# Patient Record
Sex: Male | Born: 1990 | Hispanic: Yes | Marital: Single | Smoking: Never smoker
Health system: Southern US, Community
[De-identification: ages and names within clinical notes are randomized; demographics above are authoritative.]

---

## 2020-02-07 ENCOUNTER — Encounter (HOSPITAL_COMMUNITY): Payer: Self-pay | Admitting: *Deleted

## 2020-02-07 ENCOUNTER — Emergency Department (HOSPITAL_COMMUNITY): Payer: Self-pay

## 2020-02-07 ENCOUNTER — Other Ambulatory Visit: Payer: Self-pay

## 2020-02-07 ENCOUNTER — Emergency Department (HOSPITAL_COMMUNITY)
Admission: EM | Admit: 2020-02-07 | Discharge: 2020-02-07 | Disposition: A | Discharge status: Home / Self Care | Payer: Self-pay | Attending: Emergency Medicine | Admitting: Emergency Medicine

## 2020-02-07 DIAGNOSIS — G44229 Chronic tension-type headache, not intractable: Secondary | ICD-10-CM | POA: Insufficient documentation

## 2020-02-07 MED ORDER — DIPHENHYDRAMINE HCL 50 MG/ML IJ SOLN
25.0000 mg | Freq: Once | INTRAMUSCULAR | Status: AC
  Administered 2020-02-07: 25 mg via INTRAVENOUS
  Filled 2020-02-07: qty 1

## 2020-02-07 MED ORDER — ACETAMINOPHEN 325 MG PO TABS
650.0000 mg | ORAL_TABLET | Freq: Once | ORAL | Status: AC
  Administered 2020-02-07: 650 mg via ORAL
  Filled 2020-02-07: qty 2

## 2020-02-07 MED ORDER — SODIUM CHLORIDE 0.9 % IV BOLUS
1000.0000 mL | Freq: Once | INTRAVENOUS | Status: AC
  Administered 2020-02-07: 1000 mL via INTRAVENOUS

## 2020-02-07 MED ORDER — METOCLOPRAMIDE HCL 5 MG/ML IJ SOLN
10.0000 mg | Freq: Once | INTRAMUSCULAR | Status: AC
  Administered 2020-02-07: 10 mg via INTRAVENOUS
  Filled 2020-02-07: qty 2

## 2020-02-07 NOTE — ED Notes (Signed)
Got patient on the monitor patient is resting with call bell in reach  ?
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
## 2020-02-07 NOTE — Discharge Instructions (Addendum)
The CT scan of your head today did not show any abnormal findings or any causes of your headache. It is important for you to follow-up with the primary care provider listed below. You can continue medication such as Tylenol or ibuprofen to help with your headache. Return to the ER if you start to experience worsening headache, blurry vision, numbness in arms or legs, chest pain, neck stiffness, injuries or falls.    La tomografa computarizada de su cabeza hoy no mostr ningn hallazgo anormal ni ninguna causa de su dolor de [place]. Es importante que realice un seguimiento con el proveedor de atencin primaria que se indica a continuacin. Puede continuar con medicamentos como Tylenol o ibuprofeno para ayudar con su dolor de cabeza. Regrese a la sala de emergencias si comienza a experimentar un empeoramiento del dolor de cabeza, visin borrosa, entumecimiento en brazos o piernas, dolor en el pecho, rigidez en el cuello, lesiones o cadas.
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
## 2021-06-20 ENCOUNTER — Other Ambulatory Visit: Payer: Self-pay

## 2021-06-20 ENCOUNTER — Encounter: Payer: Self-pay | Admitting: Emergency Medicine

## 2021-06-20 ENCOUNTER — Ambulatory Visit (INDEPENDENT_AMBULATORY_CARE_PROVIDER_SITE_OTHER): Payer: Self-pay

## 2021-06-20 ENCOUNTER — Ambulatory Visit (HOSPITAL_COMMUNITY)
Admission: EM | Admit: 2021-06-20 | Discharge: 2021-06-20 | Disposition: A | Discharge status: Home / Self Care | Payer: Self-pay | Attending: Family Medicine | Admitting: Family Medicine

## 2021-06-20 ENCOUNTER — Ambulatory Visit
Admission: EM | Admit: 2021-06-20 | Discharge: 2021-06-20 | Disposition: A | Discharge status: Home / Self Care | Payer: Self-pay | Attending: Internal Medicine | Admitting: Internal Medicine

## 2021-06-20 DIAGNOSIS — M79642 Pain in left hand: Secondary | ICD-10-CM

## 2021-06-20 DIAGNOSIS — S62325A Displaced fracture of shaft of fourth metacarpal bone, left hand, initial encounter for closed fracture: Secondary | ICD-10-CM

## 2021-06-20 DIAGNOSIS — M25532 Pain in left wrist: Secondary | ICD-10-CM

## 2021-06-20 MED ORDER — HYDROCODONE-ACETAMINOPHEN 5-325 MG PO TABS: 1.0000 | ORAL_TABLET | Freq: Four times a day (QID) | ORAL | 0 refills | Status: AC | PRN

## 2021-06-20 NOTE — ED Provider Notes (Signed)
?[facility] ? ? ? ?CSN: [id number] ?Arrival date & time: 06/20/21  0947 ? ? ?  ? ?History   ?Chief Complaint ?Chief Complaint  ?Patient presents with  ? Hand Injury  ? ? ?HPI ?Andrew Fletcher is a 31 y.o. male.  ? ?Patient presents with left hand and wrist pain that started yesterday after an injury.  Patient reports that he was playing soccer yesterday and fell landing with his hand hyperflexed.  He is currently having left fourth digit finger pain, dorsal surface of left hand pain, left wrist pain.  Patient has full range of motion of wrist and hand.  Denies any numbness or tingling.  Patient has not taken any medications to help alleviate symptoms. ? ? ?Hand Injury ? ?History reviewed. No pertinent past medical history. ? ?There are no problems to display for this patient. ? ? ?History reviewed. No pertinent surgical history. ? ? ? ? ?Home Medications   ? ?Prior to Admission medications   ?Medication Sig Start Date End Date Taking? Authorizing Provider  ?HYDROcodone-acetaminophen (NORCO) 5-325 MG tablet Take 1 tablet by mouth every 6 (six) hours as needed for moderate pain. 06/20/21  Yes Gustavus Bryant, FNP  ? ? ?Family History ?History reviewed. No pertinent family history. ? ?Social History ?Social History  ? ?Tobacco Use  ? Smoking status: Never  ? Smokeless tobacco: Never  ?Substance Use Topics  ? Alcohol use: Never  ? Drug use: Never  ? ? ? ?Allergies   ?Patient has no known allergies. ? ? ?Review of Systems ?Review of Systems ?Per HPI ? ?Physical Exam ?Triage Vital Signs ?ED Triage Vitals  ?Enc Vitals Group  ?   BP 06/20/21 1049 109/68  ?   Pulse Rate 06/20/21 1049 70  ?   Resp 06/20/21 1049 18  ?   Temp 06/20/21 1049 98.4 ?F (36.9 ?C)  ?   Temp Source 06/20/21 1049 Oral  ?   SpO2 06/20/21 1049 98 %  ?   Weight 06/20/21 1050 180 lb (81.6 kg)  ?   Height 06/20/21 1050 5' 10.08" (1.78 m)  ?   Head Circumference --   ?   Peak Flow --   ?   Pain Score 06/20/21 1050 10  ?   Pain Loc --   ?   Pain  Edu? --   ?   Excl. in GC? --   ? ?No data found. ? ?Updated Vital Signs ?BP 109/68 (BP Location: Left Arm)   Pulse 70   Temp 98.4 ?F (36.9 ?C) (Oral)   Resp 18   Ht 5' 10.08" (1.78 m)   Wt 180 lb (81.6 kg)   SpO2 98%   BMI 25.77 kg/m?  ? ?Visual Acuity ?Right Eye Distance:   ?Left Eye Distance:   ?Bilateral Distance:   ? ?Right Eye Near:   ?Left Eye Near:    ?Bilateral Near:    ? ?Physical Exam ?Constitutional:   ?   General: He is not in acute distress. ?   Appearance: Normal appearance. He is not toxic-appearing or diaphoretic.  ?HENT:  ?   Head: Normocephalic and atraumatic.  ?Eyes:  ?   Extraocular Movements: Extraocular movements intact.  ?   Conjunctiva/sclera: Conjunctivae normal.  ?Pulmonary:  ?   Effort: Pulmonary effort is normal.  ?Musculoskeletal:  ?   Comments: Tenderness to palpation to dorsal surface overlying fourth metacarpal.  Also tenderness to palpation generalized throughout left fourth digit.  No obvious swelling  or discoloration noted.  No lacerations or abrasions noted.  Patient has full range of motion of digits, wrist, hand.  Tenderness to palpation generalized throughout left wrist.  Tenderness to palpation slightly into the left anterior forearm as well.  Neurovascular intact.  ?Neurological:  ?   General: No focal deficit present.  ?   Mental Status: He is alert and oriented to person, place, and time. Mental status is at baseline.  ?Psychiatric:     ?   Mood and Affect: Mood normal.     ?   Behavior: Behavior normal.     ?   Thought Content: Thought content normal.     ?   Judgment: Judgment normal.  ? ? ? ?UC Treatments / Results  ?Labs ?(all labs ordered are listed, but only abnormal results are displayed) ?Labs Reviewed - No data to display ? ?EKG ? ? ?Radiology ?DG Wrist Complete Left ? ?Result Date: 06/20/2021 ?CLINICAL DATA:  Left wrist pain after soccer injury yesterday. EXAM: LEFT WRIST - COMPLETE 3+ VIEW COMPARISON:  None. FINDINGS: Minimally displaced fourth metacarpal  fracture is noted. No other bony abnormality is noted. Joint spaces are intact. IMPRESSION: Minimally displaced fourth metacarpal fracture. Electronically Signed   By: Lupita Raider M.D.   On: 06/20/2021 11:27  ? ?DG Hand Complete Left ? ?Result Date: 06/20/2021 ?CLINICAL DATA:  Left hand pain after playing soccer yesterday. EXAM: LEFT HAND - COMPLETE 3+ VIEW COMPARISON:  None. FINDINGS: Minimally displaced oblique fracture is seen involving the fourth metacarpal. Joint spaces are intact. No other bony abnormality is noted. IMPRESSION: Minimally displaced fourth metacarpal fracture. Electronically Signed   By: Lupita Raider M.D.   On: 06/20/2021 11:26   ? ?Procedures ?Procedures (including critical care time) ? ?Medications Ordered in UC ?Medications - No data to display ? ?Initial Impression / Assessment and Plan / UC Course  ?I have reviewed the triage vital signs and the nursing notes. ? ?Pertinent labs & imaging results that were available during my care of the patient were reviewed by me and considered in my medical decision making (see chart for details). ? ?  ? ?Minimally displaced left fourth metacarpal fracture. Charma Igo was consulted due to it being minimally displaced.  He advised ulnar gutter splint and to follow-up this week with Dr. Merlyn Lot with hand specialty.  Patient provided with contact information.  I do not have any splinting supplies here in this urgent care so patient was sent to urgent care on [facility] for splinting by [employer].  Discussed ice application and supportive care.  Norco prescribed as needed for pain.  Advised patient this can cause drowsiness and to not drive while taking this medication.  Patient verbalized understanding and was agreeable with plan.  Interpreter used throughout patient interaction. ?Final Clinical Impressions(s) / UC Diagnoses  ? ?Final diagnoses:  ?Displaced fracture of shaft of fourth metacarpal bone, left hand, initial encounter for closed  fracture  ? ? ? ?Discharge Instructions   ? ?  ?Go to provided address for [facility] urgent care on [facility] to have splint placed.  Follow-up with provided contact information today to schedule an appointment this week to see hand orthopedic specialist.  Please use ice application and elevate extremity as well.  Pain medication is prescribed for you to take as needed.  Please be advised that it can make you drowsy. ? ? ? ? ?ED Prescriptions   ? ? Medication Sig Dispense Auth. Provider  ? HYDROcodone-acetaminophen (  NORCO) 5-325 MG tablet Take 1 tablet by mouth every 6 (six) hours as needed for moderate pain. 30 tablet Gustavus BryantMound, Mariacristina Aday E, [place]  ? ?  ? ?I have reviewed the PDMP during this encounter. ?  ?Gustavus BryantMound, Phu Record E, [place]NP ?06/20/21 1153 ? ?

## 2021-06-20 NOTE — ED Notes (Signed)
Pt was tld by this nurse to go to the urgent care in [place] for the splint to be placed.  ?

## 2021-06-20 NOTE — ED Notes (Signed)
Ortho techs called.  ?

## 2021-06-20 NOTE — ED Triage Notes (Signed)
Patient injured his left hand while playing soccer yesterday.  Left hand is somewhat swelling, patient denies any OTC meds. ?

## 2021-06-20 NOTE — Discharge Instructions (Addendum)
Go to provided address for [facility] urgent care on [facility] to have splint placed.  Follow-up with provided contact information today to schedule an appointment this week to see hand orthopedic specialist.  Please use ice application and elevate extremity as well.  Pain medication is prescribed for you to take as needed.  Please be advised that it can make you drowsy. ?

## 2021-06-20 NOTE — Progress Notes (Signed)
Orthopedic Tech Progress Note ?Patient Details:  ?Andrew Fletcher ?06/08/1990 ?[id number] ? ?Ortho Devices ?Type of Ortho Device: Ulna gutter splint ?Ortho Device/Splint Location: LUE ?Ortho Device/Splint Interventions: Ordered, Application ?  ?Post Interventions ?Patient Tolerated: Well ?Instructions Provided: Care of device ? ?[name] ?06/20/2021, 1:36 PM ? ?

## 2023-05-17 IMAGING — DX DG HAND COMPLETE 3+V*L*
3 series · 3 of 3 positions shown · non-contrast
Comparison: None.

CLINICAL DATA: Left hand pain after playing soccer yesterday.

EXAM:
LEFT HAND - COMPLETE 3+ VIEW

[hand pa]
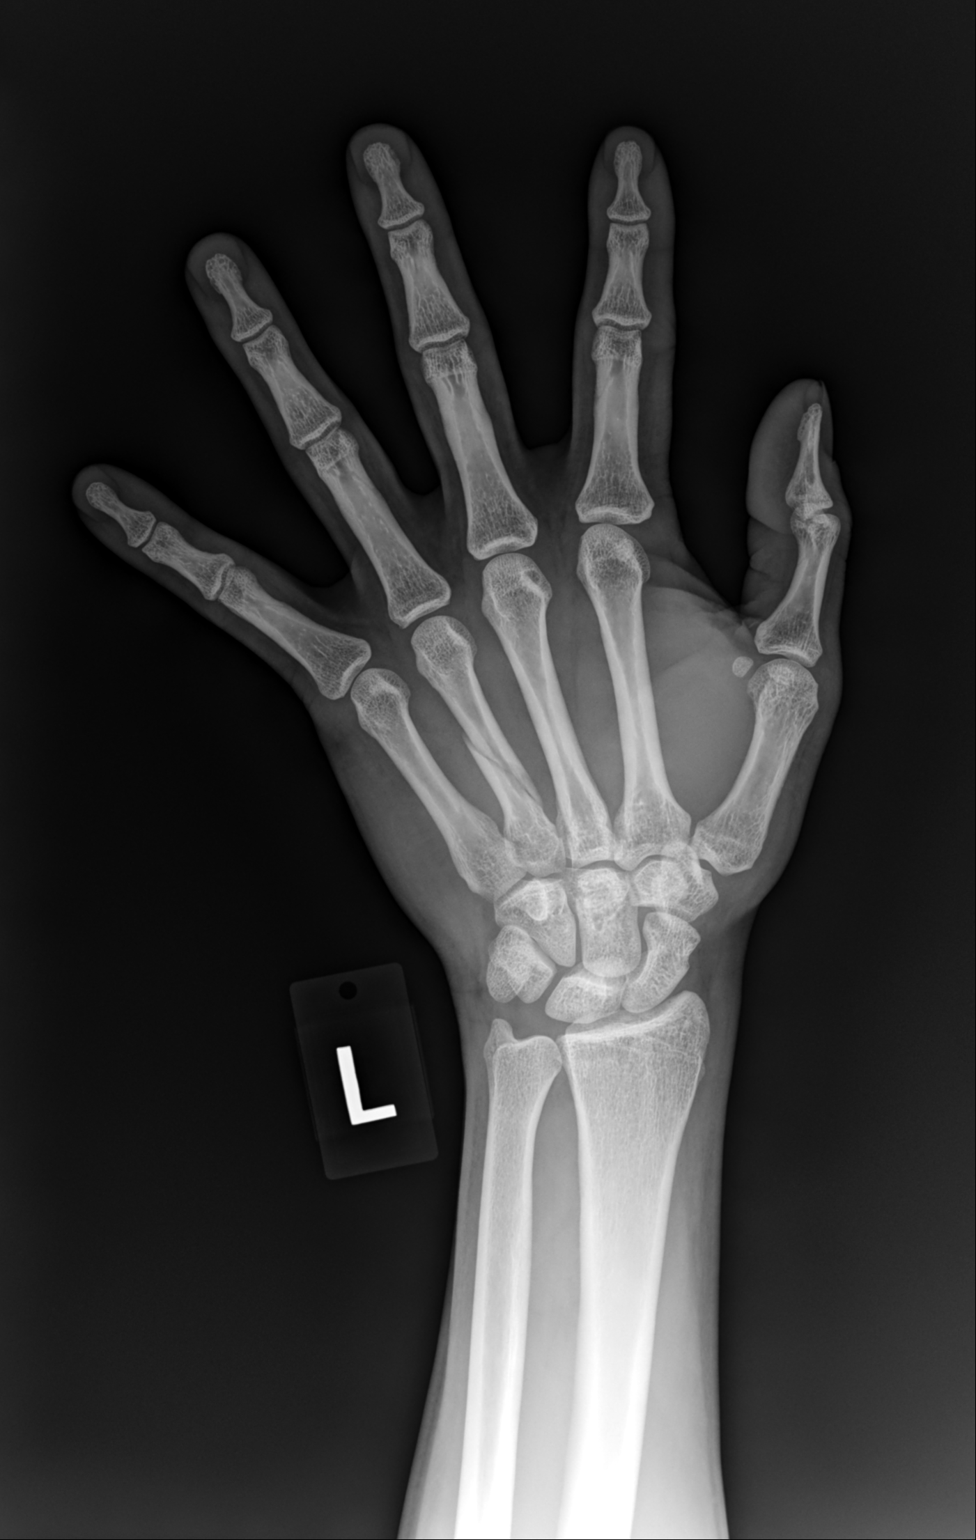

[hand mlo]
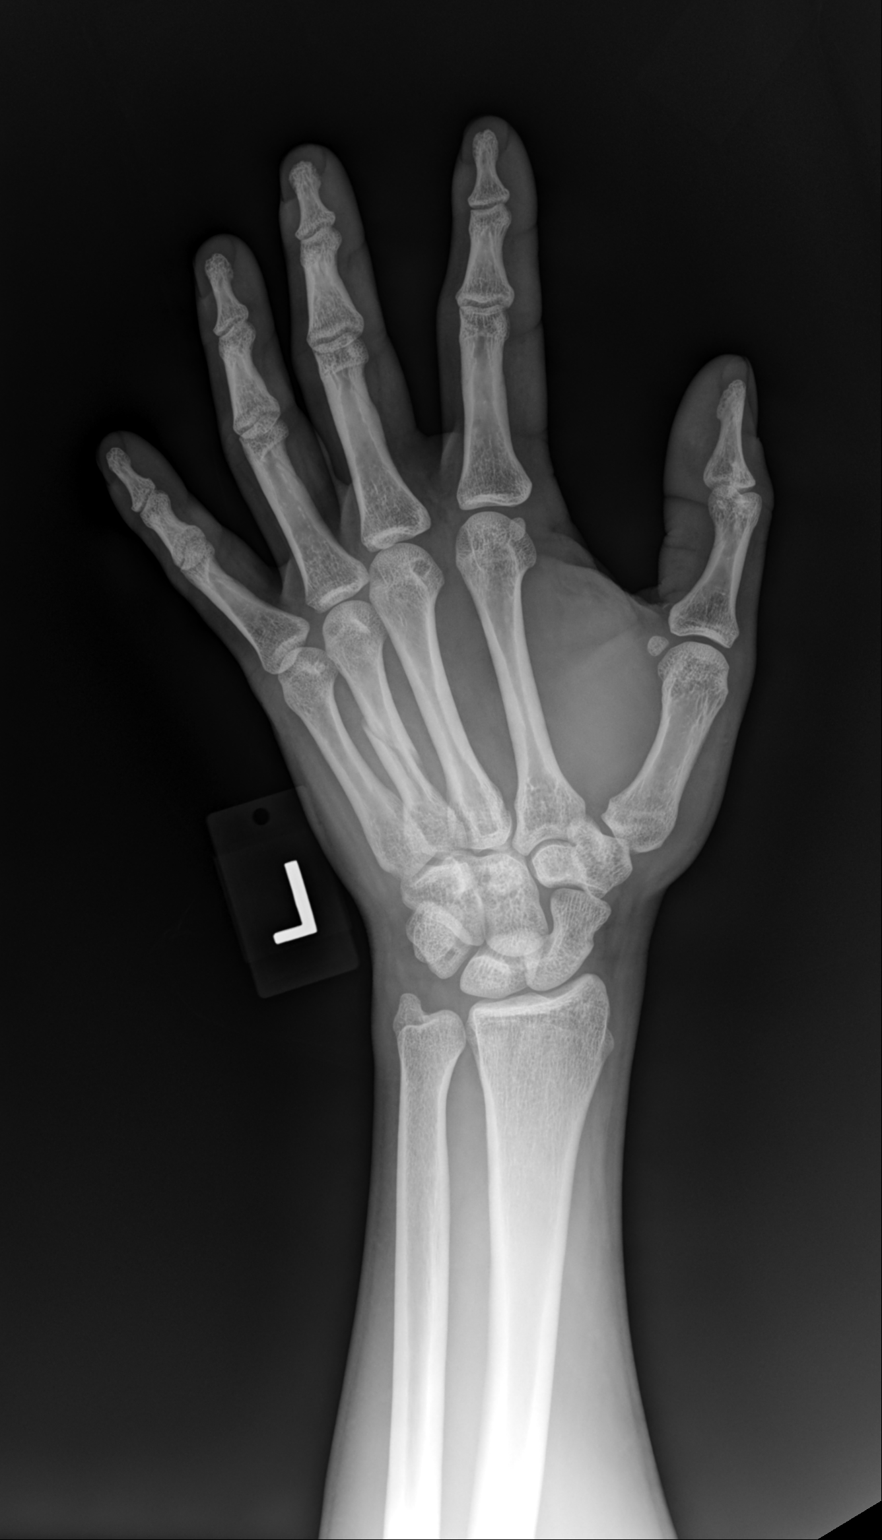

[hand lat]
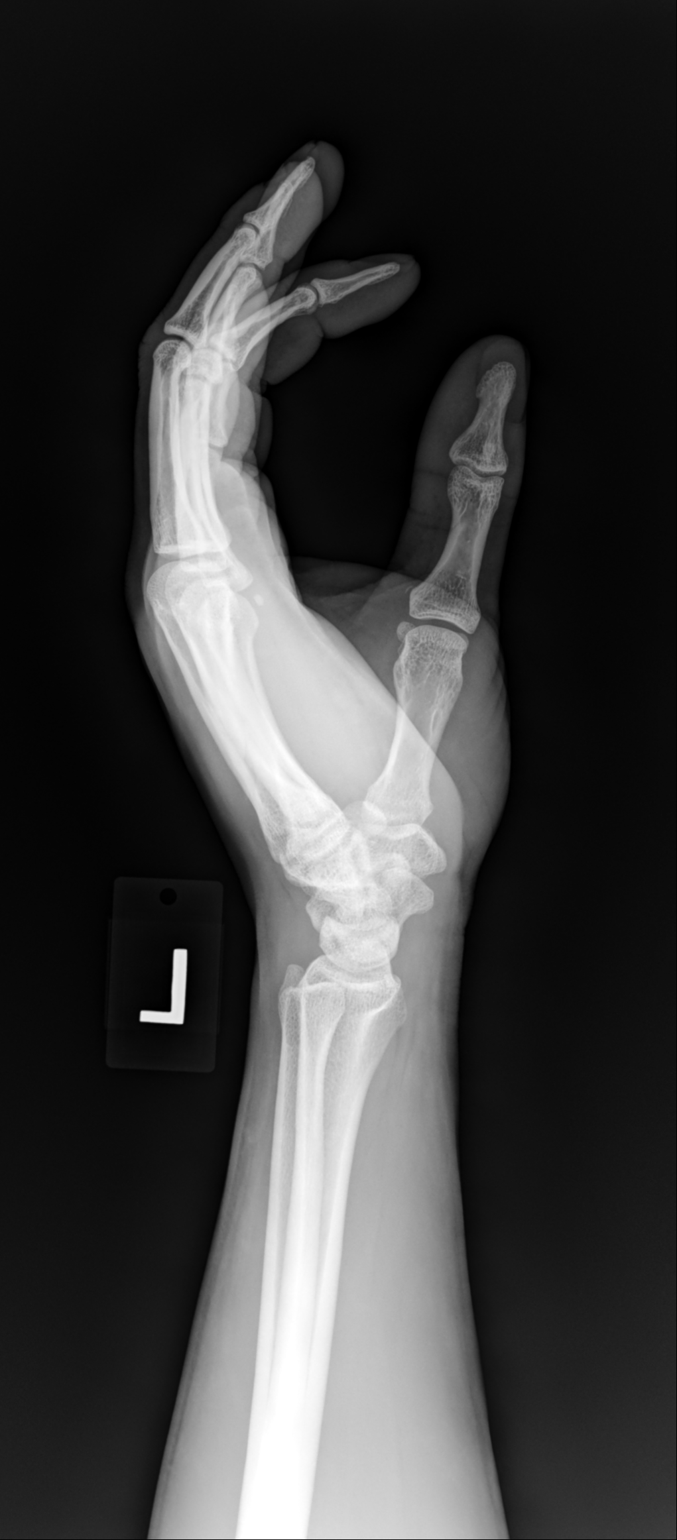

[3 of 3 positions shown; findings below may reference images not displayed]

FINDINGS: Minimally displaced oblique fracture is seen involving the fourth
metacarpal. Joint spaces are intact. No other bony abnormality is
noted.
IMPRESSION: Minimally displaced fourth metacarpal fracture.

## 2023-05-24 ENCOUNTER — Ambulatory Visit
Admission: EM | Admit: 2023-05-24 | Discharge: 2023-05-24 | Disposition: A | Discharge status: Home / Self Care | Payer: Self-pay | Attending: Physician Assistant | Admitting: Physician Assistant

## 2023-05-24 ENCOUNTER — Encounter: Payer: Self-pay | Admitting: Emergency Medicine

## 2023-05-24 DIAGNOSIS — J40 Bronchitis, not specified as acute or chronic: Secondary | ICD-10-CM

## 2023-05-24 DIAGNOSIS — J329 Chronic sinusitis, unspecified: Secondary | ICD-10-CM

## 2023-05-24 DIAGNOSIS — R197 Diarrhea, unspecified: Secondary | ICD-10-CM

## 2023-05-24 LAB — POC COVID19/FLU A&B COMBO
Covid Antigen, POC: NEGATIVE
Influenza A Antigen, POC: NEGATIVE
Influenza B Antigen, POC: NEGATIVE

## 2023-05-24 MED ORDER — AMOXICILLIN-POT CLAVULANATE 875-125 MG PO TABS: 1.0000 | ORAL_TABLET | Freq: Two times a day (BID) | ORAL | 0 refills | Status: AC

## 2023-05-24 NOTE — ED Triage Notes (Addendum)
 Pt reports headache, cough, nausea, diarrhea and nasal congestion x12 days. 2 emesis episodes yesterday. Pt notes 6 loose stools in past 24hrs. Reports decreased intake due to upset stomach. Taking dayquil/nyquil at home with no relief.   Triage completed with interpreter, Hollice Espy [phone]

## 2023-05-24 NOTE — ED Provider Notes (Addendum)
 [facility]    CSN: [id number] Arrival date & time: 05/24/23  4098      History   Chief Complaint Chief Complaint  Patient presents with   Headache   Cough   Emesis   Nausea    HPI Andrew Fletcher is a 33 y.o. male.   Patient here today for evaluation of headache, cough, congestion that he has had the last 12 days.  He has also had nausea and diarrhea during this time.  He has not had any blood in his stool or dark tarry stools.  He denies any abdominal pain.  He denies any recent travel or concerning food choices that might have led to diarrhea.  He does report fever at times but has not checked temperature just has had increased sweating.  The history is provided by the patient. The history is limited by a language barrier. A language interpreter was used.  Headache Associated symptoms: congestion, cough, diarrhea, fever, nausea and sore throat   Associated symptoms: no abdominal pain, no ear pain and no vomiting   Cough Associated symptoms: chills, diaphoresis, fever, headaches and sore throat   Associated symptoms: no ear pain, no eye discharge and no shortness of breath   Emesis Associated symptoms: chills, cough, diarrhea, fever, headaches and sore throat   Associated symptoms: no abdominal pain     History reviewed. No pertinent past medical history.  There are no active problems to display for this patient.   History reviewed. No pertinent surgical history.     Home Medications    Prior to Admission medications   Medication Sig Start Date End Date Taking? Authorizing Provider  amoxicillin-clavulanate (AUGMENTIN) 875-125 MG tablet Take 1 tablet by mouth every 12 (twelve) hours. 05/24/23  Yes Tomi Bamberger, PA-C  HYDROcodone-acetaminophen (NORCO) 5-325 MG tablet Take 1 tablet by mouth every 6 (six) hours as needed for moderate pain. Patient not taking: Reported on 05/24/2023 06/20/21   Gustavus Bryant, FNP    Family History History reviewed.  No pertinent family history.  Social History Social History   Tobacco Use   Smoking status: Never   Smokeless tobacco: Never  Vaping Use   Vaping status: Never Used  Substance Use Topics   Alcohol use: Never   Drug use: Never     Allergies   Patient has no known allergies.   Review of Systems Review of Systems  Constitutional:  Positive for chills, diaphoresis and fever.  HENT:  Positive for congestion and sore throat. Negative for ear pain.   Eyes:  Negative for discharge and redness.  Respiratory:  Positive for cough. Negative for shortness of breath.   Gastrointestinal:  Positive for diarrhea and nausea. Negative for abdominal pain, anal bleeding, blood in stool and vomiting.  Neurological:  Positive for headaches.     Physical Exam Triage Vital Signs ED Triage Vitals [05/24/23 0938]  Encounter Vitals Group     BP 127/79     Systolic BP Percentile      Diastolic BP Percentile      Pulse Rate 80     Resp 14     Temp 97.6 F (36.4 C)     Temp Source Oral     SpO2 98 %     Weight      Height      Head Circumference      Peak Flow      Pain Score 8     Pain Loc  Pain Education      Exclude from Growth Chart    No data found.  Updated Vital Signs BP 127/79 (BP Location: Left Arm)   Pulse 80   Temp 97.6 F (36.4 C) (Oral)   Resp 14   SpO2 98%   Visual Acuity Right Eye Distance:   Left Eye Distance:   Bilateral Distance:    Right Eye Near:   Left Eye Near:    Bilateral Near:     Physical Exam Vitals and nursing note reviewed.  Constitutional:      General: He is not in acute distress.    Appearance: Normal appearance. He is not ill-appearing.  HENT:     Head: Normocephalic and atraumatic.     Right Ear: Tympanic membrane normal.     Left Ear: Tympanic membrane normal.     Nose: Nose normal. No congestion.     Mouth/Throat:     Mouth: Mucous membranes are moist.     Pharynx: Oropharynx is clear. No oropharyngeal exudate or posterior  oropharyngeal erythema.  Eyes:     Conjunctiva/sclera: Conjunctivae normal.  Cardiovascular:     Rate and Rhythm: Normal rate and regular rhythm.     Heart sounds: Normal heart sounds. No murmur heard. Pulmonary:     Effort: Pulmonary effort is normal. No respiratory distress.     Breath sounds: Normal breath sounds. No wheezing, rhonchi or rales.  Abdominal:     General: Bowel sounds are normal. There is no distension.     Palpations: Abdomen is soft.     Tenderness: There is no abdominal tenderness.  Skin:    General: Skin is warm and dry.  Neurological:     Mental Status: He is alert.  Psychiatric:        Mood and Affect: Mood normal.        Thought Content: Thought content normal.      UC Treatments / Results  Labs (all labs ordered are listed, but only abnormal results are displayed) Labs Reviewed  POC COVID19/FLU A&B COMBO - Normal  GASTROINTESTINAL PANEL BY PCR, STOOL (REPLACES STOOL CULTURE)  C DIFFICILE QUICK SCREEN W PCR REFLEX    OVA + PARASITE EXAM    EKG   Radiology No results found.  Procedures Procedures (including critical care time)  Medications Ordered in UC Medications - No data to display  Initial Impression / Assessment and Plan / UC Course  I have reviewed the triage vital signs and the nursing notes.  Pertinent labs & imaging results that were available during my care of the patient were reviewed by me and considered in my medical decision making (see chart for details).    Will treat to cover possible bacterial upper respiratory infection with Augmentin.  Given duration of diarrhea will also order stool cultures/GI panel.  Recommended bland diet, increase fluids while awaiting results.  Advised probiotic yogurt to help restore gut flora, especially following diarrhea and now antibiotic therapy. Encouraged follow-up with any worsening symptoms, and evaluation in the emergency room with any abdominal pain.   Final Clinical Impressions(s) /  UC Diagnoses   Final diagnoses:  Diarrhea, unspecified type  Sinobronchitis   Discharge Instructions   None    ED Prescriptions     Medication Sig Dispense Auth. Provider   amoxicillin-clavulanate (AUGMENTIN) 875-125 MG tablet Take 1 tablet by mouth every 12 (twelve) hours. 14 tablet Tomi Bamberger, PA-C      PDMP not reviewed this encounter.   Izola Price,  Armandina Stammer, PA-C 05/24/23 1626    Tomi Bamberger, PA-C 05/24/23 [phone]
